# Patient Record
Sex: Female | Born: 1985 | Race: White | Hispanic: No | Marital: Married
Health system: Southern US, Community
[De-identification: ages and names within clinical notes are randomized; demographics above are authoritative.]

## PROBLEM LIST (undated history)

## (undated) DIAGNOSIS — N289 Disorder of kidney and ureter, unspecified: Secondary | ICD-10-CM

---
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
## 2003-02-08 ENCOUNTER — Emergency Department (HOSPITAL_COMMUNITY): Admission: AD | Admit: 2003-02-08 | Discharge: 2003-02-08 | Payer: Self-pay

## 2003-03-02 ENCOUNTER — Encounter: Admission: RE | Admit: 2003-03-02 | Discharge: 2003-03-02 | Payer: Self-pay | Admitting: Sports Medicine

## 2003-03-09 ENCOUNTER — Encounter: Admission: RE | Admit: 2003-03-09 | Discharge: 2003-03-09 | Payer: Self-pay | Admitting: Family Medicine

## 2003-03-09 ENCOUNTER — Encounter (INDEPENDENT_AMBULATORY_CARE_PROVIDER_SITE_OTHER): Payer: Self-pay | Admitting: *Deleted

## 2003-03-13 ENCOUNTER — Ambulatory Visit (HOSPITAL_COMMUNITY): Admission: RE | Admit: 2003-03-13 | Discharge: 2003-03-13 | Payer: Self-pay | Admitting: *Deleted

## 2003-05-03 ENCOUNTER — Ambulatory Visit (HOSPITAL_COMMUNITY): Admission: RE | Admit: 2003-05-03 | Discharge: 2003-05-03 | Payer: Self-pay | Admitting: Family Medicine

## 2003-05-23 ENCOUNTER — Encounter: Admission: RE | Admit: 2003-05-23 | Discharge: 2003-05-23 | Payer: Self-pay | Admitting: Family Medicine

## 2003-06-22 ENCOUNTER — Encounter: Admission: RE | Admit: 2003-06-22 | Discharge: 2003-06-22 | Payer: Self-pay | Admitting: Family Medicine

## 2003-06-28 ENCOUNTER — Encounter: Admission: RE | Admit: 2003-06-28 | Discharge: 2003-06-28 | Payer: Self-pay | Admitting: Family Medicine

## 2003-07-14 ENCOUNTER — Other Ambulatory Visit: Admission: RE | Admit: 2003-07-14 | Discharge: 2003-07-14 | Payer: Self-pay | Admitting: Family Medicine

## 2003-07-14 ENCOUNTER — Encounter: Admission: RE | Admit: 2003-07-14 | Discharge: 2003-07-14 | Payer: Self-pay | Admitting: Family Medicine

## 2003-07-18 ENCOUNTER — Ambulatory Visit (HOSPITAL_COMMUNITY): Admission: RE | Admit: 2003-07-18 | Discharge: 2003-07-18 | Payer: Self-pay | Admitting: Family Medicine

## 2003-07-28 ENCOUNTER — Encounter: Admission: RE | Admit: 2003-07-28 | Discharge: 2003-07-28 | Payer: Self-pay | Admitting: Family Medicine

## 2003-08-11 ENCOUNTER — Encounter: Admission: RE | Admit: 2003-08-11 | Discharge: 2003-08-11 | Payer: Self-pay | Admitting: Family Medicine

## 2003-08-23 ENCOUNTER — Ambulatory Visit (HOSPITAL_COMMUNITY): Admission: RE | Admit: 2003-08-23 | Discharge: 2003-08-23 | Payer: Self-pay | Admitting: Family Medicine

## 2003-08-24 ENCOUNTER — Encounter: Admission: RE | Admit: 2003-08-24 | Discharge: 2003-08-24 | Payer: Self-pay | Admitting: Family Medicine

## 2003-09-05 ENCOUNTER — Encounter: Admission: RE | Admit: 2003-09-05 | Discharge: 2003-09-05 | Payer: Self-pay | Admitting: Family Medicine

## 2003-09-14 ENCOUNTER — Encounter: Admission: RE | Admit: 2003-09-14 | Discharge: 2003-09-14 | Payer: Self-pay | Admitting: *Deleted

## 2003-09-19 ENCOUNTER — Encounter: Admission: RE | Admit: 2003-09-19 | Discharge: 2003-09-19 | Payer: Self-pay | Admitting: *Deleted

## 2003-09-22 ENCOUNTER — Encounter: Admission: RE | Admit: 2003-09-22 | Discharge: 2003-09-22 | Payer: Self-pay | Admitting: Family Medicine

## 2003-09-26 ENCOUNTER — Ambulatory Visit (HOSPITAL_COMMUNITY): Admission: RE | Admit: 2003-09-26 | Discharge: 2003-09-26 | Payer: Self-pay | Admitting: Family Medicine

## 2003-09-26 ENCOUNTER — Encounter: Admission: RE | Admit: 2003-09-26 | Discharge: 2003-09-26 | Payer: Self-pay | Admitting: *Deleted

## 2003-09-26 ENCOUNTER — Inpatient Hospital Stay (HOSPITAL_COMMUNITY): Admission: AD | Admit: 2003-09-26 | Discharge: 2003-09-29 | Payer: Self-pay | Admitting: *Deleted

## 2003-11-19 ENCOUNTER — Emergency Department (HOSPITAL_COMMUNITY): Admission: EM | Admit: 2003-11-19 | Discharge: 2003-11-20 | Payer: Self-pay | Admitting: Emergency Medicine

## 2003-12-08 ENCOUNTER — Other Ambulatory Visit: Admission: RE | Admit: 2003-12-08 | Discharge: 2003-12-08 | Payer: Self-pay | Admitting: Family Medicine

## 2003-12-08 ENCOUNTER — Encounter: Admission: RE | Admit: 2003-12-08 | Discharge: 2003-12-08 | Payer: Self-pay | Admitting: Family Medicine

## 2004-04-08 ENCOUNTER — Encounter: Admission: RE | Admit: 2004-04-08 | Discharge: 2004-04-08 | Payer: Self-pay | Admitting: Family Medicine

## 2004-05-28 ENCOUNTER — Emergency Department (HOSPITAL_COMMUNITY): Admission: EM | Admit: 2004-05-28 | Discharge: 2004-05-29 | Payer: Self-pay | Admitting: Emergency Medicine

## 2004-06-03 ENCOUNTER — Encounter: Admission: RE | Admit: 2004-06-03 | Discharge: 2004-06-03 | Payer: Self-pay | Admitting: Sports Medicine

## 2004-06-09 ENCOUNTER — Emergency Department (HOSPITAL_COMMUNITY): Admission: EM | Admit: 2004-06-09 | Discharge: 2004-06-09 | Payer: Self-pay | Admitting: Emergency Medicine

## 2004-06-15 ENCOUNTER — Emergency Department (HOSPITAL_COMMUNITY): Admission: EM | Admit: 2004-06-15 | Discharge: 2004-06-15 | Payer: Self-pay | Admitting: Emergency Medicine

## 2004-07-15 ENCOUNTER — Ambulatory Visit: Payer: Self-pay | Admitting: Family Medicine

## 2004-08-05 ENCOUNTER — Ambulatory Visit: Payer: Self-pay | Admitting: Family Medicine

## 2004-09-03 ENCOUNTER — Emergency Department (HOSPITAL_COMMUNITY): Admission: EM | Admit: 2004-09-03 | Discharge: 2004-09-03 | Payer: Self-pay | Admitting: *Deleted

## 2004-09-09 ENCOUNTER — Ambulatory Visit: Payer: Self-pay | Admitting: Family Medicine

## 2004-11-18 ENCOUNTER — Ambulatory Visit: Payer: Self-pay | Admitting: Family Medicine

## 2004-11-19 ENCOUNTER — Ambulatory Visit: Payer: Self-pay | Admitting: Sports Medicine

## 2004-11-27 ENCOUNTER — Ambulatory Visit: Payer: Self-pay | Admitting: Sports Medicine

## 2004-12-31 ENCOUNTER — Other Ambulatory Visit: Admission: RE | Admit: 2004-12-31 | Discharge: 2004-12-31 | Payer: Self-pay | Admitting: Family Medicine

## 2004-12-31 ENCOUNTER — Ambulatory Visit: Payer: Self-pay | Admitting: Family Medicine

## 2005-01-21 ENCOUNTER — Ambulatory Visit (HOSPITAL_COMMUNITY): Admission: RE | Admit: 2005-01-21 | Discharge: 2005-01-21 | Payer: Self-pay | Admitting: Family Medicine

## 2005-02-03 ENCOUNTER — Ambulatory Visit: Payer: Self-pay | Admitting: Family Medicine

## 2005-02-08 ENCOUNTER — Emergency Department (HOSPITAL_COMMUNITY): Admission: EM | Admit: 2005-02-08 | Discharge: 2005-02-08 | Payer: Self-pay | Admitting: Emergency Medicine

## 2005-02-18 ENCOUNTER — Ambulatory Visit (HOSPITAL_COMMUNITY): Admission: RE | Admit: 2005-02-18 | Discharge: 2005-02-18 | Payer: Self-pay | Admitting: Family Medicine

## 2005-02-28 ENCOUNTER — Ambulatory Visit: Payer: Self-pay | Admitting: Family Medicine

## 2005-03-31 ENCOUNTER — Ambulatory Visit: Payer: Self-pay | Admitting: Sports Medicine

## 2005-05-02 ENCOUNTER — Ambulatory Visit: Payer: Self-pay | Admitting: Family Medicine

## 2005-05-06 ENCOUNTER — Inpatient Hospital Stay (HOSPITAL_COMMUNITY): Admission: AD | Admit: 2005-05-06 | Discharge: 2005-05-06 | Payer: Self-pay | Admitting: Family Medicine

## 2005-05-08 ENCOUNTER — Ambulatory Visit: Payer: Self-pay | Admitting: *Deleted

## 2005-05-12 ENCOUNTER — Ambulatory Visit: Payer: Self-pay | Admitting: Obstetrics & Gynecology

## 2005-05-20 ENCOUNTER — Ambulatory Visit: Payer: Self-pay | Admitting: *Deleted

## 2005-05-23 ENCOUNTER — Ambulatory Visit: Payer: Self-pay | Admitting: Obstetrics and Gynecology

## 2005-05-27 ENCOUNTER — Ambulatory Visit: Payer: Self-pay | Admitting: Obstetrics and Gynecology

## 2005-05-27 ENCOUNTER — Ambulatory Visit (HOSPITAL_COMMUNITY): Admission: RE | Admit: 2005-05-27 | Discharge: 2005-05-27 | Payer: Self-pay | Admitting: *Deleted

## 2005-06-12 ENCOUNTER — Ambulatory Visit: Payer: Self-pay | Admitting: Family Medicine

## 2005-06-17 ENCOUNTER — Ambulatory Visit: Payer: Self-pay | Admitting: *Deleted

## 2005-06-25 ENCOUNTER — Inpatient Hospital Stay (HOSPITAL_COMMUNITY): Admission: AD | Admit: 2005-06-25 | Discharge: 2005-06-27 | Payer: Self-pay | Admitting: Family Medicine

## 2005-06-25 ENCOUNTER — Ambulatory Visit: Payer: Self-pay | Admitting: Family Medicine

## 2005-06-25 ENCOUNTER — Inpatient Hospital Stay (HOSPITAL_COMMUNITY): Admission: AD | Admit: 2005-06-25 | Discharge: 2005-06-25 | Payer: Self-pay | Admitting: Family Medicine

## 2005-10-21 ENCOUNTER — Ambulatory Visit: Payer: Self-pay | Admitting: Family Medicine

## 2005-12-15 ENCOUNTER — Ambulatory Visit: Payer: Self-pay | Admitting: Sports Medicine

## 2006-01-11 ENCOUNTER — Encounter (INDEPENDENT_AMBULATORY_CARE_PROVIDER_SITE_OTHER): Payer: Self-pay | Admitting: *Deleted

## 2006-01-11 LAB — CONVERTED CEMR LAB

## 2006-01-30 ENCOUNTER — Ambulatory Visit: Payer: Self-pay | Admitting: Family Medicine

## 2006-02-27 ENCOUNTER — Ambulatory Visit: Payer: Self-pay | Admitting: Family Medicine

## 2006-03-27 ENCOUNTER — Ambulatory Visit (HOSPITAL_COMMUNITY): Admission: RE | Admit: 2006-03-27 | Discharge: 2006-03-27 | Payer: Self-pay | Admitting: Family Medicine

## 2006-04-16 ENCOUNTER — Ambulatory Visit: Payer: Self-pay | Admitting: Family Medicine

## 2006-06-12 ENCOUNTER — Ambulatory Visit: Payer: Self-pay | Admitting: Family Medicine

## 2006-12-10 DIAGNOSIS — F17200 Nicotine dependence, unspecified, uncomplicated: Secondary | ICD-10-CM | POA: Insufficient documentation

## 2006-12-10 DIAGNOSIS — F411 Generalized anxiety disorder: Secondary | ICD-10-CM | POA: Insufficient documentation

## 2006-12-11 ENCOUNTER — Encounter (INDEPENDENT_AMBULATORY_CARE_PROVIDER_SITE_OTHER): Payer: Self-pay | Admitting: *Deleted

## 2009-11-08 ENCOUNTER — Emergency Department (HOSPITAL_COMMUNITY): Admission: EM | Admit: 2009-11-08 | Discharge: 2009-11-08 | Payer: Self-pay | Admitting: Emergency Medicine

## 2010-02-10 ENCOUNTER — Emergency Department (HOSPITAL_COMMUNITY): Admission: EM | Admit: 2010-02-10 | Discharge: 2010-02-10 | Payer: Self-pay | Admitting: Emergency Medicine

## 2010-02-14 ENCOUNTER — Emergency Department (HOSPITAL_COMMUNITY): Admission: EM | Admit: 2010-02-14 | Discharge: 2010-02-14 | Payer: Self-pay | Admitting: Family Medicine

## 2010-02-14 ENCOUNTER — Emergency Department (HOSPITAL_COMMUNITY): Admission: EM | Admit: 2010-02-14 | Discharge: 2010-02-15 | Payer: Self-pay | Admitting: Emergency Medicine

## 2010-02-21 ENCOUNTER — Encounter: Payer: Self-pay | Admitting: Family Medicine

## 2010-03-20 ENCOUNTER — Emergency Department (HOSPITAL_COMMUNITY): Admission: EM | Admit: 2010-03-20 | Discharge: 2010-03-20 | Payer: Self-pay | Admitting: Emergency Medicine

## 2010-03-22 ENCOUNTER — Inpatient Hospital Stay (HOSPITAL_COMMUNITY): Admission: EM | Admit: 2010-03-22 | Discharge: 2010-03-25 | Payer: Self-pay | Admitting: Emergency Medicine

## 2010-03-25 ENCOUNTER — Encounter (INDEPENDENT_AMBULATORY_CARE_PROVIDER_SITE_OTHER): Payer: Self-pay

## 2010-05-13 IMAGING — CT CT NECK W/ CM
3 of 4 series · 10 of 20 positions shown, 12 images · IV contrast (75ml omni 300)
Comparison: None.

CLINICAL DATA: 23-year-old female with pain, swelling at the right
face of acute onset.

CT NECK WITH CONTRAST
TECHNIQUE: Multidetector CT imaging of the neck was performed
using the standard protocol following the bolus administration of
intravenous contrast.
Contrast: 75 ml [id number].

[Series 2: neck w/cm · axial · 0.48mm/px · z∈[-317,-162]mm · 5 of 94 slices shown, 7 images]
[im 16/94  soft-tissue]
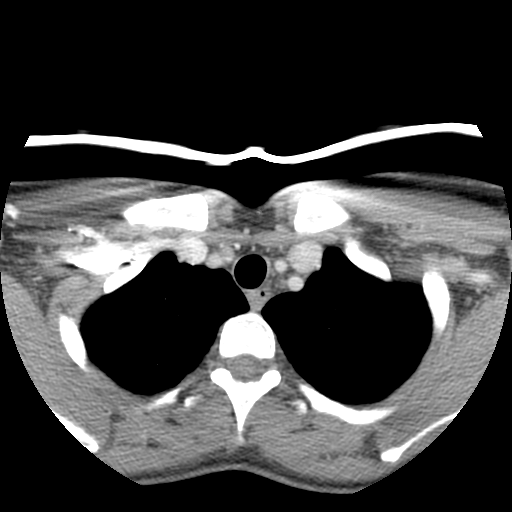
[im 16/94  bone]
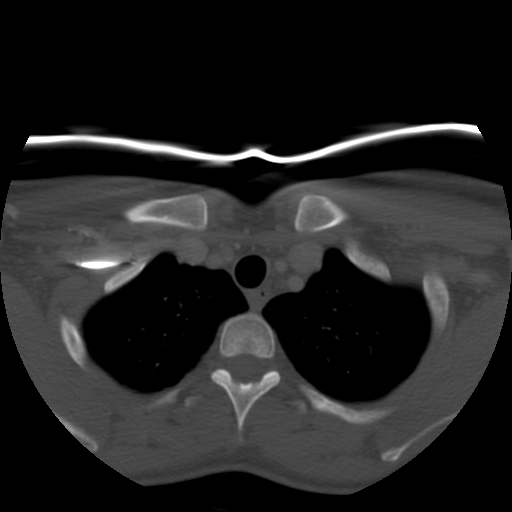
[im 32/94  bone]
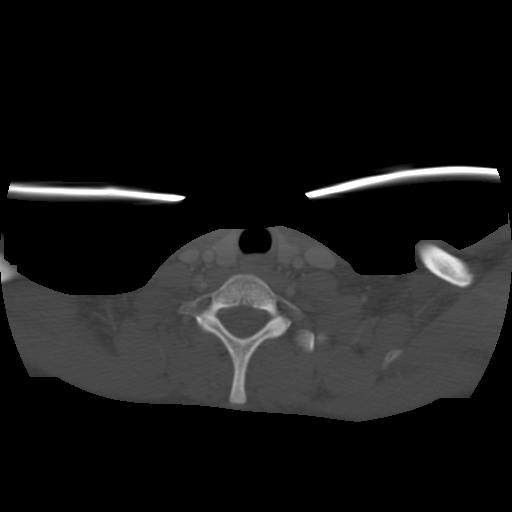
[im 47/94  bone]
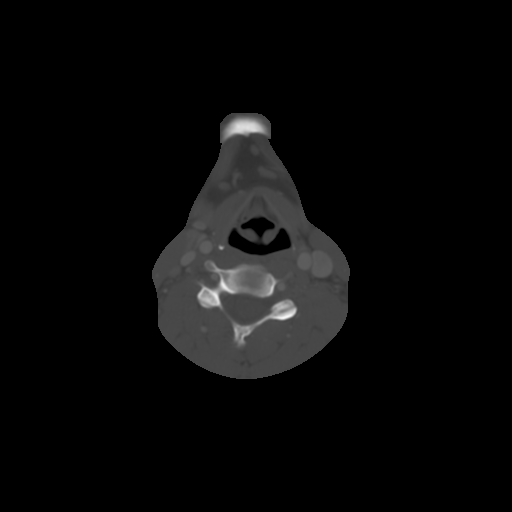
[im 63/94  bone]
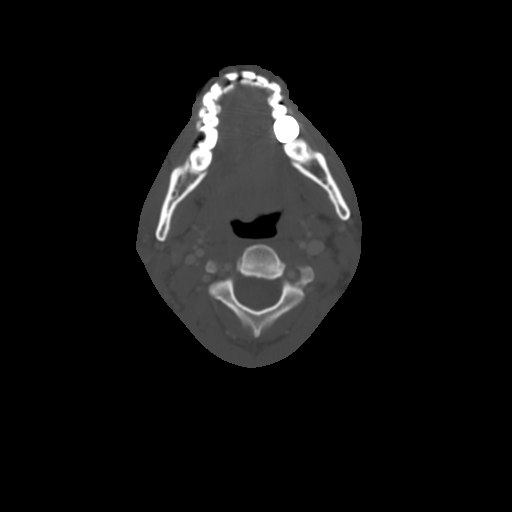
[im 78/94  soft-tissue]
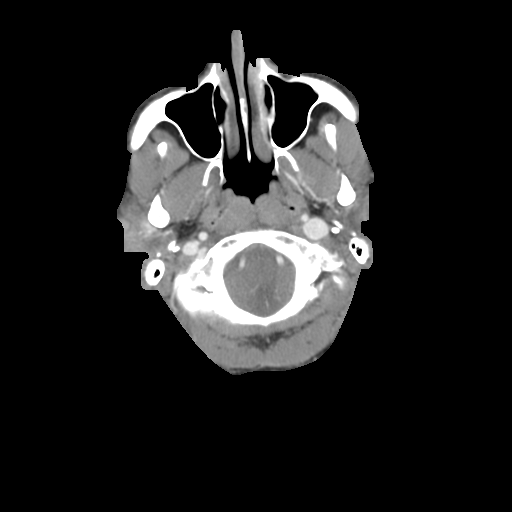
[im 78/94  bone]
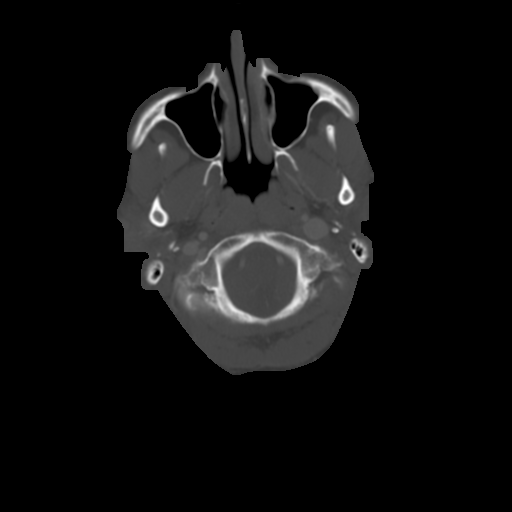

[Series 300: sag · sagittal · 0.48mm/px · 2 of 60 slices shown]
[im 20/60  bone]
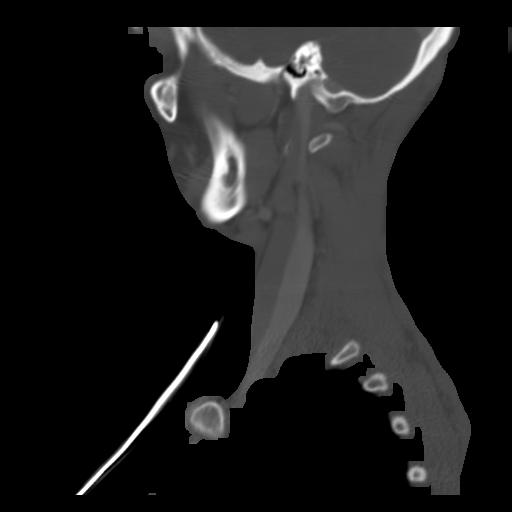
[im 40/60  bone]
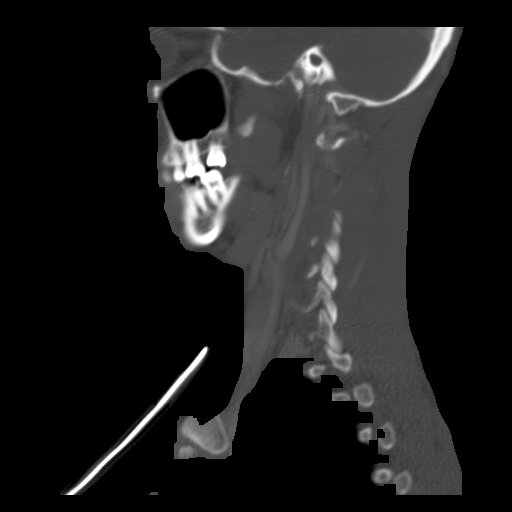

[Series 301: cor · coronal · 0.48mm/px · 3 of 62 slices shown]
[im 16/62  bone]
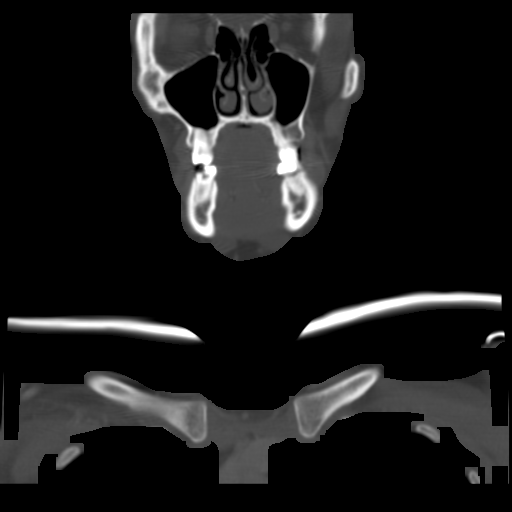
[im 26/62  bone]
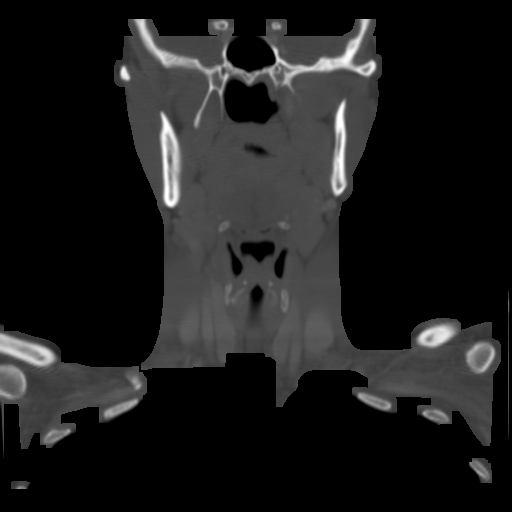
[im 35/62  bone]
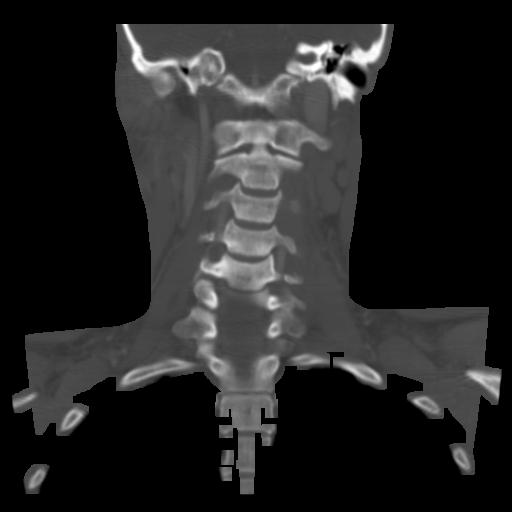

[10 of 20 positions shown; findings below may reference images not displayed]

FINDINGS: Visualized lung apices are clear.  Visualized brain
parenchyma, orbits, paranasal sinuses, mastoids, tympanic cavities,
osseous structures and major vascular structures are within normal
limits.

There is a soft tissue thickening and heterogeneous enhancement in
the right periauricular soft tissues involving the cartilaginous
portion of the right external auditory canal.  There is patchy
enhancement of the right parotid gland.  There are small
hyperenhancing intraparotid and suboccipital lymph nodes,
individually measuring up to 8 mm in short axis.  There are
hyperenhancing nodes on the right from levels I through III, the
largest that level IIA measuring 10 mm in short axis. The right
parapharyngeal space is unaffected as is the majority of the deep
lobe of the right parotid gland.  The right parotid duct is
nondilated.  There is overlying subcutaneous fat stranding.  The
right pinna does not appear significantly enlarged.

Pharyngeal mucosal spaces and the thyroid are within normal limits.
Parapharyngeal, retropharyngeal and sublingual spaces are within
normal limits.  Submandibular and left parotid glands are within
normal limits.
IMPRESSION: 1.  Inflammatory changes centered at the cartilaginous right
external auditory canal.  Likely secondary involvement of the
parotid gland.  No definite involvement of the pinna.  Favor acute
infectious external otitis.
2.  Hyperenhancing suboccipital and right cervical lymph nodes may
be reactive or associated lymphadenitis.
3.  No abscess or parapharyngeal space extension.

## 2010-11-12 NOTE — Progress Notes (Signed)
Summary: [facility]  [facility]   Imported By: Lind Guest 02/27/2010 08:30:15  _____________________________________________________________________  External Attachment:    Type:   Image     Comment:   External Document

## 2010-12-29 LAB — COMPREHENSIVE METABOLIC PANEL
ALT: 29 U/L (ref 0–35)
AST: 54 U/L — ABNORMAL HIGH (ref 0–37)
Albumin: 3.9 g/dL (ref 3.5–5.2)
Alkaline Phosphatase: 72 U/L (ref 39–117)
BUN: 10 mg/dL (ref 6–23)
CO2: 20 mEq/L (ref 19–32)
Calcium: 9.3 mg/dL (ref 8.4–10.5)
Chloride: 105 mEq/L (ref 96–112)
Creatinine, Ser: 0.59 mg/dL (ref 0.4–1.2)
GFR calc Af Amer: >60 mL/min (ref >60)
GFR calc non Af Amer: >60 mL/min (ref >60)
Glucose, Bld: 142 mg/dL — ABNORMAL HIGH (ref 70–99)
Potassium: 2.8 mEq/L — ABNORMAL LOW (ref 3.5–5.1)
Sodium: 135 mEq/L (ref 135–145)
Total Bilirubin: 0.7 mg/dL (ref 0.3–1.2)
Total Protein: 7 g/dL (ref 6.0–8.3)

## 2010-12-29 LAB — CBC
HCT: 41.5 % (ref 36.0–46.0)
Hemoglobin: 14.5 g/dL (ref 12.0–15.0)
MCHC: 34.9 g/dL (ref 30.0–36.0)
MCV: 90.3 fL (ref 78.0–100.0)
Platelets: 295 10*3/uL (ref 150–400)
RBC: 4.6 MIL/uL (ref 3.87–5.11)
RDW: 12.5 % (ref 11.5–15.5)
WBC: 15.8 10*3/uL — ABNORMAL HIGH (ref 4.0–10.5)

## 2010-12-29 LAB — URINALYSIS, ROUTINE W REFLEX MICROSCOPIC
Glucose, UA: NEGATIVE mg/dL
Ketones, ur: 15 mg/dL — AB
Nitrite: NEGATIVE
Protein, ur: NEGATIVE mg/dL
Urobilinogen, UA: 1 mg/dL (ref 0.0–1.0)

## 2010-12-29 LAB — DIFFERENTIAL
Basophils Absolute: 0 10*3/uL (ref 0.0–0.1)
Basophils Relative: 0 % (ref 0–1)
Eosinophils Absolute: 0.1 10*3/uL (ref 0.0–0.7)
Eosinophils Relative: 1 % (ref 0–5)
Lymphocytes Relative: 13 % (ref 12–46)
Lymphs Abs: 2.1 10*3/uL (ref 0.7–4.0)
Monocytes Absolute: 0.8 10*3/uL (ref 0.1–1.0)
Monocytes Relative: 5 % (ref 3–12)
Neutro Abs: 12.8 10*3/uL — ABNORMAL HIGH (ref 1.7–7.7)
Neutrophils Relative %: 81 % — ABNORMAL HIGH (ref 43–77)

## 2010-12-29 LAB — URINE MICROSCOPIC-ADD ON

## 2010-12-29 LAB — LIPASE, BLOOD: Lipase: 19 U/L (ref 11–59)

## 2010-12-30 LAB — DIFFERENTIAL
Basophils Absolute: 0 10*3/uL (ref 0.0–0.1)
Basophils Relative: 0 % (ref 0–1)
Eosinophils Absolute: 0 10*3/uL (ref 0.0–0.7)
Eosinophils Relative: 0 % (ref 0–5)
Lymphs Abs: 0.6 10*3/uL — ABNORMAL LOW (ref 0.7–4.0)
Lymphs Abs: 1.7 10*3/uL (ref 0.7–4.0)
Monocytes Absolute: 0.1 10*3/uL (ref 0.1–1.0)
Monocytes Absolute: 0.4 10*3/uL (ref 0.1–1.0)
Monocytes Absolute: 0.8 10*3/uL (ref 0.1–1.0)
Monocytes Relative: 1 % — ABNORMAL LOW (ref 3–12)
Monocytes Relative: 6 % (ref 3–12)
Neutro Abs: 11.4 10*3/uL — ABNORMAL HIGH (ref 1.7–7.7)
Neutro Abs: 6.7 10*3/uL (ref 1.7–7.7)
Neutrophils Relative %: 82 % — ABNORMAL HIGH (ref 43–77)

## 2010-12-30 LAB — COMPREHENSIVE METABOLIC PANEL
ALT: 301 U/L — ABNORMAL HIGH (ref 0–35)
ALT: 348 U/L — ABNORMAL HIGH (ref 0–35)
AST: 209 U/L — ABNORMAL HIGH (ref 0–37)
Albumin: 3.4 g/dL — ABNORMAL LOW (ref 3.5–5.2)
Albumin: 4 g/dL (ref 3.5–5.2)
Albumin: 4.3 g/dL (ref 3.5–5.2)
Alkaline Phosphatase: 171 U/L — ABNORMAL HIGH (ref 39–117)
Alkaline Phosphatase: 173 U/L — ABNORMAL HIGH (ref 39–117)
BUN: 6 mg/dL (ref 6–23)
BUN: 9 mg/dL (ref 6–23)
BUN: <1 mg/dL — ABNORMAL LOW (ref 6–23)
CO2: 23 mEq/L (ref 19–32)
CO2: 24 mEq/L (ref 19–32)
CO2: 28 mEq/L (ref 19–32)
Calcium: 8.6 mg/dL (ref 8.4–10.5)
Calcium: 8.8 mg/dL (ref 8.4–10.5)
Calcium: 9.5 mg/dL (ref 8.4–10.5)
Chloride: 106 mEq/L (ref 96–112)
Chloride: 109 mEq/L (ref 96–112)
Creatinine, Ser: 0.67 mg/dL (ref 0.4–1.2)
GFR calc Af Amer: >60 mL/min (ref >60)
GFR calc non Af Amer: >60 mL/min (ref >60)
GFR calc non Af Amer: >60 mL/min (ref >60)
Glucose, Bld: 102 mg/dL — ABNORMAL HIGH (ref 70–99)
Glucose, Bld: 103 mg/dL — ABNORMAL HIGH (ref 70–99)
Glucose, Bld: 108 mg/dL — ABNORMAL HIGH (ref 70–99)
Potassium: 3.8 mEq/L (ref 3.5–5.1)
Sodium: 138 mEq/L (ref 135–145)
Sodium: 139 mEq/L (ref 135–145)
Sodium: 139 mEq/L (ref 135–145)
Total Bilirubin: 1.9 mg/dL — ABNORMAL HIGH (ref 0.3–1.2)
Total Bilirubin: 2.5 mg/dL — ABNORMAL HIGH (ref 0.3–1.2)
Total Bilirubin: 3.2 mg/dL — ABNORMAL HIGH (ref 0.3–1.2)
Total Protein: 6 g/dL (ref 6.0–8.3)
Total Protein: 6.4 g/dL (ref 6.0–8.3)
Total Protein: 7.1 g/dL (ref 6.0–8.3)

## 2010-12-30 LAB — CBC
HCT: 38.4 % (ref 36.0–46.0)
HCT: 40.1 % (ref 36.0–46.0)
HCT: 42.6 % (ref 36.0–46.0)
Hemoglobin: 12.6 g/dL (ref 12.0–15.0)
Hemoglobin: 13.9 g/dL (ref 12.0–15.0)
Hemoglobin: 14.5 g/dL (ref 12.0–15.0)
MCHC: 33.8 g/dL (ref 30.0–36.0)
MCHC: 34 g/dL (ref 30.0–36.0)
MCHC: 34.2 g/dL (ref 30.0–36.0)
MCV: 92.6 fL (ref 78.0–100.0)
MCV: 93.4 fL (ref 78.0–100.0)
Platelets: 253 10*3/uL (ref 150–400)
Platelets: 292 10*3/uL (ref 150–400)
RBC: 4.04 MIL/uL (ref 3.87–5.11)
RBC: 4.36 MIL/uL (ref 3.87–5.11)
RBC: 4.43 MIL/uL (ref 3.87–5.11)
RDW: 13.2 % (ref 11.5–15.5)
RDW: 13.3 % (ref 11.5–15.5)
RDW: 13.5 % (ref 11.5–15.5)
WBC: 9 10*3/uL (ref 4.0–10.5)

## 2010-12-30 LAB — URINE MICROSCOPIC-ADD ON

## 2010-12-30 LAB — URINALYSIS, ROUTINE W REFLEX MICROSCOPIC
Glucose, UA: NEGATIVE mg/dL
Protein, ur: NEGATIVE mg/dL
Specific Gravity, Urine: 1.01 (ref 1.005–1.030)

## 2010-12-30 LAB — POCT PREGNANCY, URINE: Preg Test, Ur: NEGATIVE

## 2022-11-19 ENCOUNTER — Emergency Department (HOSPITAL_COMMUNITY): Payer: 59

## 2022-11-19 ENCOUNTER — Emergency Department (HOSPITAL_COMMUNITY)
Admission: EM | Admit: 2022-11-19 | Discharge: 2022-11-19 | Disposition: A | Discharge status: Home / Self Care | Payer: 59 | Attending: Emergency Medicine | Admitting: Emergency Medicine

## 2022-11-19 ENCOUNTER — Encounter (HOSPITAL_COMMUNITY): Payer: Self-pay

## 2022-11-19 DIAGNOSIS — N3090 Cystitis, unspecified without hematuria: Secondary | ICD-10-CM | POA: Diagnosis not present

## 2022-11-19 DIAGNOSIS — E876 Hypokalemia: Secondary | ICD-10-CM | POA: Diagnosis not present

## 2022-11-19 DIAGNOSIS — R112 Nausea with vomiting, unspecified: Secondary | ICD-10-CM

## 2022-11-19 DIAGNOSIS — E860 Dehydration: Secondary | ICD-10-CM

## 2022-11-19 DIAGNOSIS — E871 Hypo-osmolality and hyponatremia: Secondary | ICD-10-CM | POA: Insufficient documentation

## 2022-11-19 DIAGNOSIS — F17200 Nicotine dependence, unspecified, uncomplicated: Secondary | ICD-10-CM | POA: Insufficient documentation

## 2022-11-19 DIAGNOSIS — J111 Influenza due to unidentified influenza virus with other respiratory manifestations: Secondary | ICD-10-CM | POA: Diagnosis not present

## 2022-11-19 HISTORY — DX: Disorder of kidney and ureter, unspecified: N28.9

## 2022-11-19 LAB — CBC
HCT: 41.5 % (ref 36.0–46.0)
Hemoglobin: 14 g/dL (ref 12.0–15.0)
MCH: 28.5 pg (ref 26.0–34.0)
MCHC: 33.7 g/dL (ref 30.0–36.0)
MCV: 84.5 fL (ref 80.0–100.0)
Platelets: 284 10*3/uL (ref 150–400)
RBC: 4.91 MIL/uL (ref 3.87–5.11)
RDW: 14.1 % (ref 11.5–15.5)
WBC: 9 10*3/uL (ref 4.0–10.5)
nRBC: 0 % (ref 0.0–0.2)

## 2022-11-19 LAB — URINALYSIS, ROUTINE W REFLEX MICROSCOPIC
Bilirubin Urine: NEGATIVE
Glucose, UA: NEGATIVE mg/dL
Ketones, ur: 5 mg/dL — AB
Leukocytes,Ua: NEGATIVE
Nitrite: NEGATIVE
Protein, ur: 100 mg/dL — AB
RBC / HPF: >50 RBC/hpf (ref 0–5)
Specific Gravity, Urine: 1.026 (ref 1.005–1.030)
pH: 5 (ref 5.0–8.0)

## 2022-11-19 LAB — COMPREHENSIVE METABOLIC PANEL
ALT: 37 U/L (ref 0–44)
AST: 43 U/L — ABNORMAL HIGH (ref 15–41)
Albumin: 4 g/dL (ref 3.5–5.0)
Alkaline Phosphatase: 79 U/L (ref 38–126)
Anion gap: 10 (ref 5–15)
BUN: 7 mg/dL (ref 6–20)
CO2: 21 mmol/L — ABNORMAL LOW (ref 22–32)
Calcium: 8.8 mg/dL — ABNORMAL LOW (ref 8.9–10.3)
Chloride: 103 mmol/L (ref 98–111)
Creatinine, Ser: 0.72 mg/dL (ref 0.44–1.00)
GFR, Estimated: >60 mL/min (ref >60)
Glucose, Bld: 111 mg/dL — ABNORMAL HIGH (ref 70–99)
Potassium: 2.9 mmol/L — ABNORMAL LOW (ref 3.5–5.1)
Sodium: 134 mmol/L — ABNORMAL LOW (ref 135–145)
Total Bilirubin: 0.5 mg/dL (ref 0.3–1.2)
Total Protein: 8 g/dL (ref 6.5–8.1)

## 2022-11-19 LAB — I-STAT BETA HCG BLOOD, ED (MC, WL, AP ONLY): I-stat hCG, quantitative: <5 m[IU]/mL (ref <5)

## 2022-11-19 MED ORDER — CEFADROXIL 500 MG PO CAPS: 500.0000 mg | ORAL_CAPSULE | Freq: Two times a day (BID) | ORAL | 0 refills | Status: AC

## 2022-11-19 MED ORDER — POTASSIUM CHLORIDE CRYS ER 20 MEQ PO TBCR
40.0000 meq | EXTENDED_RELEASE_TABLET | Freq: Once | ORAL | Status: AC
  Administered 2022-11-19: 40 meq via ORAL
  Filled 2022-11-19: qty 2

## 2022-11-19 MED ORDER — SODIUM CHLORIDE 0.9 % IV BOLUS
1000.0000 mL | Freq: Once | INTRAVENOUS | Status: AC
  Administered 2022-11-19: 1000 mL via INTRAVENOUS

## 2022-11-19 MED ORDER — ONDANSETRON HCL 4 MG/2ML IJ SOLN
4.0000 mg | Freq: Once | INTRAMUSCULAR | Status: AC
  Administered 2022-11-19: 4 mg via INTRAVENOUS
  Filled 2022-11-19: qty 2

## 2022-11-19 MED ORDER — IPRATROPIUM-ALBUTEROL 0.5-2.5 (3) MG/3ML IN SOLN
3.0000 mL | Freq: Once | RESPIRATORY_TRACT | Status: AC
  Administered 2022-11-19: 3 mL via RESPIRATORY_TRACT
  Filled 2022-11-19: qty 3

## 2022-11-19 MED ORDER — ONDANSETRON HCL 4 MG PO TABS: 4.0000 mg | ORAL_TABLET | Freq: Four times a day (QID) | ORAL | 0 refills | Status: AC

## 2022-11-19 NOTE — Discharge Instructions (Addendum)
Use ibuprofen, Tylenol for body aches, fever, drink plenty of fluids, including Pedialyte, Gatorade, other electrolyte containing fluids, you can use the Zofran up to every 6 hours as needed for any nausea, vomiting, I would gently increase your diet, starting with soups, broths, rice, applesauce, toast, bananas, until you are able to tolerate your normal diet.  You can begin taking the antibiotics that I prescribed for possible early UTI, you may receive a call from our pharmacist if we note that a different antibiotic is required or that you can stop taking them entirely secondary to a contaminated sample.

## 2022-11-19 NOTE — ED Provider Notes (Signed)
[facility] Provider Note   CSN: [id number] Arrival date & time: 11/19/22  1700     History  Chief Complaint  Patient presents with   Influenza    Jamie Griffin is a 37 y.o. female with past medical history significant for tobacco dependence, anxiety, patient reports frequent kidney stones who presents with concern for fever, cough, malaise, nausea, vomiting since Sunday.  Reports that she tested positive for the flu at home.  She reports that she is concerned about dehydration.  She reports that she did have small amount of burning with urination this morning, as well as some diarrhea.  She denies chest pain at rest, she does report feeling moderately short of breath, some wheezing noted on exam.  Denies history of hypertension, hyperlipidemia, diabetes, asthma, COPD.   Influenza Presenting symptoms: nausea and vomiting        Home Medications Prior to Admission medications   Medication Sig Start Date End Date Taking? Authorizing Provider  cefadroxil (DURICEF) 500 MG capsule Take 1 capsule (500 mg total) by mouth 2 (two) times daily. 11/19/22  Yes Hannahgrace Lalli H, PA-C  ondansetron (ZOFRAN) 4 MG tablet Take 1 tablet (4 mg total) by mouth every 6 (six) hours. 11/19/22  Yes Dewey Viens H, PA-C      Allergies    Patient has no known allergies.    Review of Systems   Review of Systems  Gastrointestinal:  Positive for nausea and vomiting.  All other systems reviewed and are negative.   Physical Exam Updated Vital Signs BP 119/89 (BP Location: Right Arm)   Pulse 100   Temp 99.5 F (37.5 C) (Oral)   Resp 16   SpO2 98%  Physical Exam Vitals and nursing note reviewed.  Constitutional:      General: She is not in acute distress.    Appearance: Normal appearance.  HENT:     Head: Normocephalic and atraumatic.     Mouth/Throat:     Comments: Dry mucous membranes with some white residue on tongue, without  significant intraoral pain, overall low clinical suspicion for thrush, changes likely secondary to dehydration Eyes:     General:        Right eye: No discharge.        Left eye: No discharge.  Cardiovascular:     Rate and Rhythm: Normal rate and regular rhythm.     Heart sounds: No murmur heard.    No friction rub. No gallop.  Pulmonary:     Effort: Pulmonary effort is normal.     Breath sounds: Normal breath sounds.     Comments: Coarse rhonchi and expiratory wheezing throughout bilateral lung fields, no focal consolidation noted Abdominal:     General: Bowel sounds are normal.     Palpations: Abdomen is soft.  Skin:    General: Skin is warm and dry.     Capillary Refill: Capillary refill takes less than 2 seconds.  Neurological:     Mental Status: She is alert and oriented to person, place, and time.  Psychiatric:        Mood and Affect: Mood normal.        Behavior: Behavior normal.     ED Results / Procedures / Treatments   Labs (all labs ordered are listed, but only abnormal results are displayed) Labs Reviewed  COMPREHENSIVE METABOLIC PANEL - Abnormal; Notable for the following components:      Result Value   Sodium 134 (*)  Potassium 2.9 (*)    CO2 21 (*)    Glucose, Bld 111 (*)    Calcium 8.8 (*)    AST 43 (*)    All other components within normal limits  URINALYSIS, ROUTINE W REFLEX MICROSCOPIC - Abnormal; Notable for the following components:   APPearance HAZY (*)    Hgb urine dipstick MODERATE (*)    Ketones, ur 5 (*)    Protein, ur 100 (*)    Bacteria, UA FEW (*)    All other components within normal limits  URINE CULTURE  CBC  I-STAT BETA HCG BLOOD, ED ([facility], [facility], [facility] ONLY)    EKG None  Radiology DG Chest 2 View  Result Date: 11/19/2022 CLINICAL DATA:  Flu like symptoms.  Cough. EXAM: CHEST - 2 VIEW COMPARISON:  None Available. FINDINGS: The heart size and mediastinal contours are within normal limits. Both lungs are clear. The visualized skeletal  structures are unremarkable. IMPRESSION: No active cardiopulmonary disease. Electronically Signed   By: Dorise Bullion III M.D.   On: 11/19/2022 07:30    Procedures Procedures    Medications Ordered in ED Medications  ipratropium-albuterol (DUONEB) 0.5-2.5 (3) MG/3ML nebulizer solution 3 mL (3 mLs Nebulization Given 11/19/22 0734)  sodium chloride 0.9 % bolus 1,000 mL (0 mLs Intravenous Stopped 11/19/22 0925)  ondansetron (ZOFRAN) injection 4 mg (4 mg Intravenous Given 11/19/22 0740)  potassium chloride SA (KLOR-CON M) CR tablet 40 mEq (40 mEq Oral Given 11/19/22 3149)    ED Course/ Medical Decision Making/ A&P                             Medical Decision Making Amount and/or Complexity of Data Reviewed Labs: ordered. Radiology: ordered.  Risk Prescription drug management.   This patient is a 37 y.o. female  who presents to the ED for concern of flu, nausea, vomiting, dehydration.   Differential diagnoses prior to evaluation: The emergent differential diagnosis includes, but is not limited to,  dehydration, flu, pneumonia, electrolyte derangement, gastroenteritis, or other intra-abdominal infection, she reports mild dysuria, considered PID, UTI, pyelonephritis, nephrolithiasis. This is not an exhaustive differential.   Past Medical History / Co-morbidities: Patient with renal disorder, with intermittent hematuria at baseline, she is being seen by urology to follow-up for this.  She reports positive flu test 1 to 2 days prior to arrival.  Physical Exam: Physical exam performed. The pertinent findings include: Patient with some rhonchorous breath sounds, wheezing on initial evaluation, improved after DuoNeb x 1.  She did have slight elevation of her temperature at time of discharge, temperature 99.5, but tachycardia resolved after IV fluids, she appears clinically improved on exam and is feeling better.  Lab Tests/Imaging studies: I personally interpreted labs/imaging and the pertinent  results include: CMP notable for mild hyponatremia, mild hypokalemia likely secondary to dehydration, we will orally replete potassium, encouraged ongoing oral repletion, and close PCP recheck, UA with some ketones, protein suggestive of dehydration, she is some red blood cells from her known kidney disease, reports she is not currently on her menstrual cycle, some white blood cells and few bacteria, no nitrates or leukocytes, overall low suspicion for UTI, patient did endorse some dysuria, will send for urine culture, and start her on cefadroxil to cover for early urinary tract infection especially with her history of kidney disease. I independently interpreted plain film chest xray which shows no acute intrathoracic abnormality. I agree with the radiologist interpretation.  Medications: I ordered medication including fluid bolus, potassium, Zofran, DuoNeb for wheezing, hypokalemia, dehydration, nausea.  I have reviewed the patients home medicines and have made adjustments as needed.   Disposition: After consideration of the diagnostic results and the patients response to treatment, I feel that patient symptoms consistent with dehydration secondary to flu, nausea, vomiting, headache she is stable for discharge at this point with improvement of her wheezing, and tachycardia prior to discharge, encourage close PCP follow-up, plenty of fluids, rest.   emergency department workup does not suggest an emergent condition requiring admission or immediate intervention beyond what has been performed at this time. The plan is: as above. The patient is safe for discharge and has been instructed to return immediately for worsening symptoms, change in symptoms or any other concerns.  Final Clinical Impression(s) / ED Diagnoses Final diagnoses:  Flu  Dehydration  Hypokalemia  Cystitis  Nausea and vomiting, unspecified vomiting type    Rx / DC Orders ED Discharge Orders          Ordered    cefadroxil  (DURICEF) 500 MG capsule  2 times daily        11/19/22 0907    ondansetron (ZOFRAN) 4 MG tablet  Every 6 hours        11/19/22 0907              Dorien Chihuahua 11/19/22 0959    Quintella Reichert, MD 11/19/22 [phone]

## 2022-11-19 NOTE — ED Triage Notes (Signed)
Patient arrived stating she has had a fever since Sunday. States she tested positive for the flu. Complaints of emesis and concerned for dehydration.

## 2022-11-19 NOTE — ED Notes (Addendum)
Pt reports "flu like symptoms since Sunday that include, constant nausea, headache, cough, fever/chills and generalized fatigue.  She reports that "I know I need fluids b/c I can't keep anything down and I needed fluids last time I got the flu."  Pt tested positive for flu B on 11/17/22

## 2022-11-20 LAB — URINE CULTURE: Culture: <10000 — AB
